# Patient Record
Sex: Female | Born: 1980 | Race: White | Hispanic: No | Marital: Single | Smoking: Never smoker
Health system: Southern US, Community
[De-identification: ages and names within clinical notes are randomized; demographics above are authoritative.]

## PROBLEM LIST (undated history)

## (undated) DIAGNOSIS — T7840XA Allergy, unspecified, initial encounter: Secondary | ICD-10-CM

## (undated) DIAGNOSIS — E079 Disorder of thyroid, unspecified: Secondary | ICD-10-CM

## (undated) DIAGNOSIS — K219 Gastro-esophageal reflux disease without esophagitis: Secondary | ICD-10-CM

## (undated) HISTORY — DX: Gastro-esophageal reflux disease without esophagitis: K21.9

## (undated) HISTORY — DX: Disorder of thyroid, unspecified: E07.9

## (undated) HISTORY — PX: LAPAROSCOPIC REMOVAL ABDOMINAL MASS: SHX6360

## (undated) HISTORY — PX: WISDOM TOOTH EXTRACTION: SHX21

## (undated) HISTORY — DX: Allergy, unspecified, initial encounter: T78.40XA

---

## 2011-08-28 DIAGNOSIS — D279 Benign neoplasm of unspecified ovary: Secondary | ICD-10-CM | POA: Insufficient documentation

## 2013-07-07 ENCOUNTER — Telehealth: Payer: Self-pay | Admitting: *Deleted

## 2013-07-07 NOTE — Telephone Encounter (Signed)
Pt asked if nerve conduction test scheduled.  I contacted [facility] - [phone] Diane, she requested dictation of last visit.  06/30/2013 not available.  I am going to refer to [facility] [phone] and left message on [phone] with [facility] contact number.

## 2013-07-07 NOTE — Telephone Encounter (Signed)
I faxed pt referral sheet and pt data sheet to [facility].

## 2013-07-10 ENCOUNTER — Telehealth: Payer: Self-pay | Admitting: *Deleted

## 2013-07-10 NOTE — Telephone Encounter (Signed)
Faxed pt's referral for nerve conduction studied to [facility].

## 2013-07-26 ENCOUNTER — Ambulatory Visit (INDEPENDENT_AMBULATORY_CARE_PROVIDER_SITE_OTHER): Payer: PRIVATE HEALTH INSURANCE

## 2013-07-26 VITALS — BP 121/58 | HR 74 | Resp 16 | Ht 67.0 in | Wt 180.0 lb

## 2013-07-26 DIAGNOSIS — G629 Polyneuropathy, unspecified: Secondary | ICD-10-CM

## 2013-07-26 DIAGNOSIS — G609 Hereditary and idiopathic neuropathy, unspecified: Secondary | ICD-10-CM

## 2013-07-26 MED ORDER — PREDNISONE 10 MG PO KIT: 1.0000 | PACK | Freq: Three times a day (TID) | ORAL | Status: DC

## 2013-07-26 NOTE — Progress Notes (Signed)
  Subjective:    Patient ID: Morgan Robertson, female    DOB: [birth year], 30 y.o.   MRN: [id number] "There's no change." Reviewed the nerve conduction studies which revealed no electrophysiologic evidence of radiculopathy or polyneuropathy or myopathy, no evidence of tarsal tunnel syndrome. Patient continues to have paresthesia in abnormal sensation or muscle sensation right leg and back pain right side. Patient also continues to have numbness of her left hallux as well. HPI this problem has been going on now for several months no history of acute injury or trauma the NSAIDs have not helped significantly.    Review of Systems not performed     Objective:   Physical Exam  Constitutional: She is oriented to person, place, and time. She appears well-developed and well-nourished.  Cardiovascular:  Pulses:      Dorsalis pedis pulses are 2+ on the right side, and 2+ on the left side.       Posterior tibial pulses are 2+ on the right side, and 2+ on the left side.  Capillary refill time 3 seconds all digits. Skin temperature warm. No edema rubor or varicosities noted.  Musculoskeletal:  Orthopedic biomechanical exam unremarkable rectus foot noted mild flexible digital contractures noted one through 5 bilateral  Neurological: She is alert and oriented to person, place, and time. She has normal strength and normal reflexes.   Epicritic sensations intact and unremarkable in all areas except left hallux which shows decreased sensation in touch. Patient is also relating some paresthesia or muscle fasciculations on her right leg posteriorly at the lower back pain as well. No changes muscle strength no other gait abnormality  Skin: Skin is warm. No cyanosis. Nails show no clubbing.  Skin color pigment normal hair growth absent nails normal bilateral. No other abnormal manifestations noted to  Psychiatric: She has a normal mood and affect. Her behavior is normal. Judgment normal.          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 Patient does relate that this was possibly exacerbated and she began running. As alternative to the spinous NSAID therapy which was not helping, patient is placed on a Sterapred DS dose pack x6 days followup with in 2 months. Maintain good athletic or walking shoes. Moderate activities avoid any running or ballistic activity reevaluate as indicated in 2 months  Alvan Dame DPM

## 2013-07-26 NOTE — Patient Instructions (Signed)
Peripheral Neuropathy Peripheral neuropathy is a common disorder of your nerves resulting from damage. CAUSES  This disorder may be caused by a disease of the nerves or illness. Many neuropathies have well known causes such as:  Diabetes. This is one of the most common causes.   Uremia.   AIDS.   Nutritional deficiencies.   Other causes include mechanical pressures. These may be from:   Compression.   Injury.   Contusions or bruises.   Fracture or dislocated bones.   Pressure involving the nerves close to the surface. Nerves such as the ulnar, or radial can be injured by prolonged use of crutches.  Other injuries may come from:  Tumor.   Hemorrhage or bleeding into a nerve.   Exposure to cold or radiation.   Certain medicines or toxic substances (rare).   Vascular or collagen disorders such as:   Atherosclerosis.   Systemic lupus erythematosus.   Scleroderma.   Sarcoidosis.   Rheumatoid arthritis.   Polyarteritis nodosa.   A large number of cases are of unknown cause.  SYMPTOMS  Common problems include:  Weakness.   Numbness.   Abnormal sensations (paresthesia) such as:   Burning.   Tickling.   Pricking.   Tingling.   Pain in the arms, hands, legs and/or feet.  TREATMENT  Therapy for this disorder differs depending on the cause. It may vary from medical treatment with medications or physical therapy among others.   For example, therapy for this disorder caused by diabetes involves control of the diabetes.   In cases where a tumor or ruptured disc is the cause, therapy may involve surgery. This would be to remove the tumor or to repair the ruptured disc.   In entrapment or compression neuropathy, treatment may consist of splinting or surgical decompression of the ulnar or median nerves. A common example of entrapment neuropathy is carpal tunnel syndrome. This has become more common because of the increasing use of computers.   Peroneal and  radial compression neuropathies may require avoidance of pressure.   Physical therapy and/or splints may be useful in preventing contractures. This is a condition in which shortened muscles around joints cause abnormal and sometimes painful positioning of the joints.  Document Released: 09/11/2002 Document Revised: 06/03/2011 Document Reviewed: 09/21/2005 [employer] Patient Information 2012 [employer]. 

## 2013-09-20 ENCOUNTER — Ambulatory Visit (INDEPENDENT_AMBULATORY_CARE_PROVIDER_SITE_OTHER): Payer: 59

## 2013-09-20 VITALS — BP 120/69 | HR 79 | Resp 12

## 2013-09-20 DIAGNOSIS — G629 Polyneuropathy, unspecified: Secondary | ICD-10-CM

## 2013-09-20 DIAGNOSIS — G609 Hereditary and idiopathic neuropathy, unspecified: Secondary | ICD-10-CM

## 2013-09-20 NOTE — Progress Notes (Signed)
   Subjective:    Patient ID: Morgan Robertson, female    DOB: [birth year], 32 y.o.   MRN: [id number]  HPI Comments: '' LT FOOT STILL NUMB  AND NOTHING CHANGE''  Foot Pain Associated symptoms include numbness.   numbness or reduced sensation isolated to the left great toe plantar and distal tuft. No changes despite the steroid dose pack application. Although she did have reduced back pain symptoms during the Dosepak regimen clinically no changes in the foot skin or structure.    Review of Systems  Constitutional: Negative.   HENT: Negative.   Eyes: Negative.   Respiratory: Negative.   Cardiovascular: Negative.   Gastrointestinal: Negative.   Endocrine: Negative.   Neurological: Positive for numbness.  Hematological: Negative.   Psychiatric/Behavioral: Negative.        Objective:   Physical Exam Neurovascular status is intact pedal pulses are palpable. Patient continues to have a loss of sensation or numb spot distal tuft of her left hallux. The steroid pack to was given did help reduce some of her back pain from her herniated disc however continues to have the numbness. Nerve conduction studies were otherwise unremarkable. At this time there is no evidence or sign of injury trauma or anything of the foot that could be causing this, therefore think further evaluation by either neurosurgery or orthopedics would be beneficial as the origin of the problem may be her herniated disc.       Assessment & Plan:  Assessment peripheral neuropathy affecting her left great toe with numbness loss of sensation. Idiopathic in nature although there is a history of herniated disc and back problems currently not being treated are evaluated. Did make recommendations will make a referral to see Dr. Bernarda Caffey: Shon Baton with [facility] orthopedics for evaluation. Patient will followup with [place] if any changes or exacerbation symptoms were to occur. In the interim maintain an anti-inflammatory if needed maintain  appropriate coming shoes at all times  Alvan Dame DPM

## 2013-09-21 ENCOUNTER — Other Ambulatory Visit: Payer: Self-pay | Admitting: *Deleted

## 2013-09-21 DIAGNOSIS — R200 Anesthesia of skin: Secondary | ICD-10-CM

## 2014-05-04 ENCOUNTER — Other Ambulatory Visit: Payer: Self-pay | Admitting: Family Medicine

## 2014-05-04 ENCOUNTER — Other Ambulatory Visit (HOSPITAL_COMMUNITY)
Admission: RE | Admit: 2014-05-04 | Discharge: 2014-05-04 | Disposition: A | Discharge status: Home / Self Care | Payer: BC Managed Care – PPO | Source: Ambulatory Visit | Attending: Family Medicine | Admitting: Family Medicine

## 2014-05-04 DIAGNOSIS — Z124 Encounter for screening for malignant neoplasm of cervix: Secondary | ICD-10-CM | POA: Insufficient documentation

## 2014-05-04 DIAGNOSIS — Z1151 Encounter for screening for human papillomavirus (HPV): Secondary | ICD-10-CM | POA: Insufficient documentation

## 2014-05-08 LAB — CYTOLOGY - PAP

## 2017-01-18 DIAGNOSIS — E039 Hypothyroidism, unspecified: Secondary | ICD-10-CM | POA: Diagnosis not present

## 2017-01-18 DIAGNOSIS — Z13220 Encounter for screening for lipoid disorders: Secondary | ICD-10-CM | POA: Diagnosis not present

## 2017-01-18 DIAGNOSIS — Z0000 Encounter for general adult medical examination without abnormal findings: Secondary | ICD-10-CM | POA: Diagnosis not present

## 2017-02-05 ENCOUNTER — Encounter: Payer: Self-pay | Admitting: Podiatry

## 2017-02-05 ENCOUNTER — Ambulatory Visit (INDEPENDENT_AMBULATORY_CARE_PROVIDER_SITE_OTHER): Payer: BLUE CROSS/BLUE SHIELD | Admitting: Podiatry

## 2017-02-05 ENCOUNTER — Ambulatory Visit (INDEPENDENT_AMBULATORY_CARE_PROVIDER_SITE_OTHER): Payer: BLUE CROSS/BLUE SHIELD

## 2017-02-05 VITALS — Resp 16 | Ht 67.0 in | Wt 208.0 lb

## 2017-02-05 DIAGNOSIS — G5762 Lesion of plantar nerve, left lower limb: Secondary | ICD-10-CM

## 2017-02-05 NOTE — Progress Notes (Signed)
   Subjective:    Patient ID: Morgan Robertson, female    DOB: Apr 13, 1981, 36 y.o.   MRN: [id number]  HPI 36 year old female presents to the office today for concern of pain to the left second and third toes and she is concerned about a possible neuroma. She states the pain worsens as she stands more at work. This has been ongoing for the last few months and has been about about the same with gradual worsening. No recent injury or trauma. No recent treatment. She has no other complaints.    Review of Systems  All other systems reviewed and are negative.      Objective:   Physical Exam General: AAO x3, NAD  Dermatological: Skin is warm, dry and supple bilateral. Nails x 10 are well manicured; remaining integument appears unremarkable at this time. There are no open sores, no preulcerative lesions, no rash or signs of infection present.  Vascular: Dorsalis Pedis artery and Posterior Tibial artery pedal pulses are 2/4 bilateral with immedate capillary fill time. There is no pain with calf compression, swelling, warmth, erythema.   Neruologic: Grossly intact via light touch bilateral. Vibratory intact via tuning fork bilateral. Protective threshold with Semmes Wienstein monofilament intact to all pedal sites bilateral.   Musculoskeletal: There is mild tenderness to palpation in the left second interspace. Unable to appreciate any neuroma today but there is a small clicking sensation upon palpation consistent with a small neuroma.There is no area of pinpoint bony tenderness or pain to vibratory sensation. No other areas of tenderness identified at this time. Muscular strength 5/5 in all groups tested bilateral.  Gait: Unassisted, Nonantalgic.     Assessment & Plan:  36 year old female likely neuroma left 2nd interspace -Treatment options discussed including all alternatives, risks, and complications -Etiology of symptoms were discussed -X-rays were obtained and reviewed with the patient. No  evidence of acute fracture -She would like to proceed with a steroid injection today.A mixture of Kenalog and local anesthetic and was infiltrated along the area of maximal tenderness without complications. Post-injection care discussed -Neuroma pads dispensed -Discussed shoe changes and possible inserts -RTC as scheduled or sooner if needed.  Celesta Gentile, DPM

## 2017-08-30 ENCOUNTER — Ambulatory Visit (INDEPENDENT_AMBULATORY_CARE_PROVIDER_SITE_OTHER): Payer: BLUE CROSS/BLUE SHIELD

## 2017-08-30 ENCOUNTER — Ambulatory Visit (INDEPENDENT_AMBULATORY_CARE_PROVIDER_SITE_OTHER): Payer: BLUE CROSS/BLUE SHIELD | Admitting: Podiatry

## 2017-08-30 ENCOUNTER — Encounter: Payer: Self-pay | Admitting: Podiatry

## 2017-08-30 DIAGNOSIS — M7752 Other enthesopathy of left foot: Secondary | ICD-10-CM | POA: Diagnosis not present

## 2017-08-30 DIAGNOSIS — M21622 Bunionette of left foot: Secondary | ICD-10-CM

## 2017-08-30 DIAGNOSIS — M659 Synovitis and tenosynovitis, unspecified: Secondary | ICD-10-CM

## 2017-09-01 ENCOUNTER — Telehealth: Payer: Self-pay | Admitting: Podiatry

## 2017-09-01 NOTE — Telephone Encounter (Signed)
I saw Dr. Jacqualyn Posey this past Monday. He was supposed so send an antiinflammatory to my pharmacy and as of yesterday evening they have not received the prescription. I wanted to double check and make sure it did get put in. Thank you.

## 2017-09-01 NOTE — Progress Notes (Signed)
Subjective: Ms. Meter presents the office today for concerns of painful feet also aspect of her left foot which is been ongoing for about 2 months and is intermittent. She states it hurts worse with standing or pressure. She denies any recent injury or trauma. She points along the fifth metatarsal head where she gets her symptoms. She has no numbness or tingling. No open sores. No recent injury or trauma. No treatment. No other complaints. Denies any systemic complaints such as fevers, chills, nausea, vomiting. No acute changes since last appointment, and no other complaints at this time.   Objective: AAO x3, NAD DP/PT pulses palpable bilaterally, CRT less than 3 seconds There is subjective tenderness to palpation to the lateral aspect the left foot on the fifth metatarsal head laterally as well as plantar submetatarsal 5 however no pain on today's exam. There is no area pinpoint tenderness there is no pain the vibratory sensation. There does appear to be a small amount of fluid present some metatarsal 5 consistent with a bursitis. There is no erythema or increase in warmth. No pain with MPJ range of motion. There is no other areas of tenderness of the foot at this time. No open lesions or pre-ulcerative lesions.  No pain with calf compression, swelling, warmth, erythema  Assessment: Bursitis, Taylor's bunion left foot  Plan: -All treatment options discussed with the patient including all alternatives, risks, complications.  -X-rays were obtained and reviewed. Tailors bunion present. No evidence of acute fracture. -Discussed a steroid injection was having no pain today is that she wishes to hold off on this. Since metatarsal offloading pads. Also prescribed a topical anti-inflammatory and she wishes to hold off on anti-inflammatories. If symptoms continue discussed inserts, custom insert with modifications. Also discussed a steroid injection of the future symptoms continue. -Patient encouraged to  call the office with any questions, concerns, change in symptoms.   Morgan Robertson DPM

## 2017-09-01 NOTE — Telephone Encounter (Signed)
Sent the RX over to [facility] and the patient should be getting a call shortly and prescribed the Anti-inflammatory cream. Lattie Haw

## 2017-09-30 ENCOUNTER — Ambulatory Visit (INDEPENDENT_AMBULATORY_CARE_PROVIDER_SITE_OTHER): Payer: BLUE CROSS/BLUE SHIELD | Admitting: Podiatry

## 2017-09-30 DIAGNOSIS — M779 Enthesopathy, unspecified: Secondary | ICD-10-CM

## 2017-09-30 DIAGNOSIS — M21622 Bunionette of left foot: Secondary | ICD-10-CM

## 2017-10-04 ENCOUNTER — Telehealth: Payer: Self-pay | Admitting: Podiatry

## 2017-10-04 NOTE — Telephone Encounter (Signed)
Called pt and gave her benefit information and she is going to decide and call me back.

## 2017-10-04 NOTE — Progress Notes (Signed)
Subjective: Morgan Robertson presents the office today for follow-up evaluation of pain to the left foot on the tailor's bunion.  She states that it is somewhat improved but she still gets discomfort in this area mostly with pressure and walking.  She describes the pain about 4/10 describes as an aching sensation.  Denies any recent injury or trauma.  She has been using the topical anti-inflammatory as well as offloading pads.  She has no other concerns today. Denies any systemic complaints such as fevers, chills, nausea, vomiting. No acute changes since last appointment, and no other complaints at this time.   Objective: AAO x3, NAD DP/PT pulses palpable bilaterally, CRT less than 3 seconds There is  tenderness to palpation to the lateral aspect the left foot on the fifth metatarsal head laterally as well as plantar submetatarsal 5.  Small amount of edema is present along this area or swelling consistent with a bursitis.  There is no overlying erythema or increase in warmth.  There is no ascending sialitis.  There is no clinical signs of infection present. No open lesions or pre-ulcerative lesions.  No pain with calf compression, swelling, warmth, erythema  Assessment: Capsulitis, Taylor's bunion left foot  Plan: -All treatment options discussed with the patient including all alternatives, risks, complications.  -At this point we discussed a steroid injection she wishes to proceed.  Area was anesthetized with 3 cc of lidocaine and Marcaine plain with alcohol prep.  Once anesthetized the skin was prepped with betadine and a mixture of 1 cc Kenalog 10, 1 cc 0.5% Marcaine plain was infiltrated into the left fifth metatarsal head on the tailor's bunion.  She tolerated this well without any complications. -Continue offloading pads. -Discussed inserts.  Trula Slade, DPM

## 2017-10-18 NOTE — Telephone Encounter (Signed)
Pt called back and would like to proceed with orthotics.

## 2017-11-12 ENCOUNTER — Encounter: Payer: BLUE CROSS/BLUE SHIELD | Admitting: Orthotics

## 2017-11-12 DIAGNOSIS — M7751 Other enthesopathy of right foot: Secondary | ICD-10-CM | POA: Diagnosis not present

## 2017-11-12 DIAGNOSIS — M7752 Other enthesopathy of left foot: Secondary | ICD-10-CM | POA: Diagnosis not present

## 2018-01-24 DIAGNOSIS — E039 Hypothyroidism, unspecified: Secondary | ICD-10-CM | POA: Diagnosis not present

## 2018-05-30 DIAGNOSIS — H1045 Other chronic allergic conjunctivitis: Secondary | ICD-10-CM | POA: Diagnosis not present

## 2018-06-27 DIAGNOSIS — M62830 Muscle spasm of back: Secondary | ICD-10-CM | POA: Diagnosis not present

## 2018-06-27 DIAGNOSIS — R002 Palpitations: Secondary | ICD-10-CM | POA: Diagnosis not present

## 2018-07-22 DIAGNOSIS — B078 Other viral warts: Secondary | ICD-10-CM | POA: Diagnosis not present

## 2019-05-19 ENCOUNTER — Other Ambulatory Visit: Payer: Self-pay | Admitting: Family Medicine

## 2019-05-19 ENCOUNTER — Other Ambulatory Visit (HOSPITAL_COMMUNITY)
Admission: RE | Admit: 2019-05-19 | Discharge: 2019-05-19 | Disposition: A | Discharge status: Home / Self Care | Payer: BC Managed Care – PPO | Source: Ambulatory Visit | Attending: Family Medicine | Admitting: Family Medicine

## 2019-05-19 DIAGNOSIS — Z01411 Encounter for gynecological examination (general) (routine) with abnormal findings: Secondary | ICD-10-CM | POA: Diagnosis not present

## 2019-05-19 DIAGNOSIS — Z13220 Encounter for screening for lipoid disorders: Secondary | ICD-10-CM | POA: Diagnosis not present

## 2019-05-19 DIAGNOSIS — Z79899 Other long term (current) drug therapy: Secondary | ICD-10-CM | POA: Diagnosis not present

## 2019-05-19 DIAGNOSIS — E039 Hypothyroidism, unspecified: Secondary | ICD-10-CM | POA: Diagnosis not present

## 2019-05-19 DIAGNOSIS — Z0000 Encounter for general adult medical examination without abnormal findings: Secondary | ICD-10-CM | POA: Diagnosis not present

## 2019-05-23 LAB — CYTOLOGY - PAP
Diagnosis: NEGATIVE
HPV: NOT DETECTED

## 2019-06-15 DIAGNOSIS — Z23 Encounter for immunization: Secondary | ICD-10-CM | POA: Diagnosis not present

## 2019-08-04 ENCOUNTER — Other Ambulatory Visit: Payer: Self-pay

## 2019-08-04 ENCOUNTER — Emergency Department (HOSPITAL_COMMUNITY)
Admission: EM | Admit: 2019-08-04 | Discharge: 2019-08-04 | Disposition: A | Discharge status: Home / Self Care | Payer: No Typology Code available for payment source | Attending: Emergency Medicine | Admitting: Emergency Medicine

## 2019-08-04 ENCOUNTER — Encounter (HOSPITAL_COMMUNITY): Payer: Self-pay

## 2019-08-04 DIAGNOSIS — Y9389 Activity, other specified: Secondary | ICD-10-CM | POA: Diagnosis not present

## 2019-08-04 DIAGNOSIS — Y929 Unspecified place or not applicable: Secondary | ICD-10-CM | POA: Insufficient documentation

## 2019-08-04 DIAGNOSIS — S61211A Laceration without foreign body of left index finger without damage to nail, initial encounter: Secondary | ICD-10-CM | POA: Insufficient documentation

## 2019-08-04 DIAGNOSIS — Y990 Civilian activity done for income or pay: Secondary | ICD-10-CM | POA: Insufficient documentation

## 2019-08-04 DIAGNOSIS — Z79899 Other long term (current) drug therapy: Secondary | ICD-10-CM | POA: Insufficient documentation

## 2019-08-04 DIAGNOSIS — W268XXA Contact with other sharp object(s), not elsewhere classified, initial encounter: Secondary | ICD-10-CM | POA: Insufficient documentation

## 2019-08-04 NOTE — Discharge Instructions (Signed)
1. Medications: Tylenol or ibuprofen for pain, usual home medications  2. Treatment: ice for swelling, keep wound clean with warm soap and water and keep bandage dry, do not submerge in water for 24 hours  3. Follow Up: Follow-up with primary care doctor to have wound checked in 2 days if needed. Return to the emergency department for increased redness, drainage of pus from the wound   WOUND CARE  Keep area clean and dry for 24 hours. Do not remove bandage, if applied.  After 24 hours, remove bandage and wash wound gently with mild soap and warm water. Reapply a new bandage after cleaning wound, if directed.   Continue daily cleansing with soap and water until stitches/staples are removed. Return if you experience any of the following signs of infection: Swelling, redness, pus drainage, streaking, fever >101.0 F  Return if you experience excessive bleeding that does not stop after 15-20 minutes of constant, firm pressure.

## 2019-08-04 NOTE — ED Triage Notes (Signed)
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 States she's "washed it a couple times". Stinging pain

## 2019-08-04 NOTE — ED Provider Notes (Signed)
[facility] EMERGENCY DEPARTMENT Provider Note   CSN: [id number] Arrival date & time: 08/04/19  1436     History   Chief Complaint Chief Complaint  Patient presents with  . Finger Injury    HPI Morgan Robertson is a 38 y.o. female with past medical history as listed below presents to emergency department today with chief complaint of finger injury.  Onset was acute happening 1 hour prior to arrival.  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  She immediately washed the laceration and applied pressure.  She denies any associated pain.  Did not take anything for pain prior to arrival.  She states her tetanus vaccineis up-to-date.  She denies any numbness, tingling, weakness.  Patient is not anticoagulated.   Past Medical History:  Diagnosis Date  . Allergy   . GERD (gastroesophageal reflux disease)   . Thyroid disease     There are no active problems to display for this patient.   Past Surgical History:  Procedure Laterality Date  . LAPAROSCOPIC REMOVAL ABDOMINAL MASS    . WISDOM TOOTH EXTRACTION       OB History   No obstetric history on file.      Home Medications    Prior to Admission medications   Medication Sig Start Date End Date Taking? Authorizing Provider  levothyroxine (SYNTHROID, LEVOTHROID) 125 MCG tablet Take 125 mcg by mouth daily before breakfast.    [provider]  Multiple Vitamin (MULTIVITAMIN) capsule Take 1 capsule by mouth daily.    [provider]  NON [facility]  Anti-Inflammatory Cream- Diclofenac 3%, Baclofen 2%, Lidocaine 2% Apply 1-2 grams to affected area 3-4 times daily Qty. 120 gm 3 refills    [provider]  Tretinoin Microsphere Pump 0.04 % GEL  01/19/17   [provider]    Family History Family History  Problem Relation Age of Onset  . Hypertension Mother   .  Hyperlipidemia Father   . Hypertension Father     Social History Social History   Tobacco Use  . Smoking status: Never Smoker  . Smokeless tobacco: Never Used  Substance Use Topics  . Alcohol use: Yes    Alcohol/week: 1.0 standard drinks    Types: 1 Glasses of wine per week    Comment: one glass couple times a week.  . Drug use: No     Allergies   Patient has no known allergies.   Review of Systems Review of Systems  Constitutional: Negative for chills and fever.  Musculoskeletal: Negative for arthralgias, joint swelling and myalgias.  Skin: Positive for wound. Negative for rash.  Allergic/Immunologic: Negative for immunocompromised state.  Neurological: Negative for weakness and numbness.     Physical Exam Updated Vital Signs BP 122/85 (BP Location: Right Arm)   Pulse 81   Temp 97.8 F (36.6 C) (Oral)   Resp 16   Ht 5\' 7"  (1.702 m)   Wt 98.9 kg   SpO2 100%   BMI 34.14 kg/m   Physical Exam Vitals signs and nursing note reviewed.  Constitutional:      Appearance: She is well-developed. She is not ill-appearing or toxic-appearing.  HENT:     Head: Normocephalic and atraumatic.     Nose: Nose normal.  Eyes:     General: No scleral icterus.       Right eye: No discharge.  Left eye: No discharge.     Conjunctiva/sclera: Conjunctivae normal.  Neck:     Musculoskeletal: Normal range of motion.     Vascular: No JVD.  Cardiovascular:     Rate and Rhythm: Normal rate and regular rhythm.     Pulses:          Radial pulses are 2+ on the right side and 2+ on the left side.     Heart sounds: Normal heart sounds.  Pulmonary:     Effort: Pulmonary effort is normal.     Breath sounds: Normal breath sounds.  Abdominal:     General: There is no distension.  Musculoskeletal: Normal range of motion.  Skin:    General: Skin is warm and dry.     Comments: 1 cm skin tear to lateral aspect of middle left index finger.  Nailbed is not involved.  Bleeding is  controlled.  Full range of motion of index finger. Strong and equal grip strength bilaterally.   Neurological:     Mental Status: She is oriented to person, place, and time.     GCS: GCS eye subscore is 4. GCS verbal subscore is 5. GCS motor subscore is 6.     Comments: Fluent speech, no facial droop.  Psychiatric:        Behavior: Behavior normal.      ED Treatments / Results  Labs (all labs ordered are listed, but only abnormal results are displayed) Labs Reviewed - No data to display  EKG None  Radiology No results found.  Procedures .Marland KitchenLaceration Repair  Date/Time: 08/04/2019 4:27 PM Performed by: Cherre Robins, PA-C Authorized by: Cherre Robins, PA-C   Consent:    Consent obtained:  Verbal   Consent given by:  Patient   Risks discussed:  Infection, pain and retained foreign body   Alternatives discussed:  No treatment Anesthesia (see MAR for exact dosages):    Anesthesia method:  None Laceration details:    Location:  Finger   Finger location:  L index finger   Length (cm):  1 Repair type:    Repair type:  Simple Pre-procedure details:    Preparation:  Patient was prepped and draped in usual sterile fashion and imaging obtained to evaluate for foreign bodies Exploration:    Hemostasis achieved with:  Direct pressure   Wound exploration: wound explored through full range of motion and entire depth of wound probed and visualized     Wound extent: no muscle damage noted and no tendon damage noted     Contaminated: no   Treatment:    Area cleansed with:  Saline   Amount of cleaning:  Standard   Irrigation solution:  Sterile saline   Irrigation volume:  1 L   Irrigation method:  Syringe   Visualized foreign bodies/material removed: no   Skin repair:    Repair method:  Steri-Strips   Number of Steri-Strips:  2 Approximation:    Approximation:  Close Post-procedure details:    Dressing:  Bulky dressing   Patient tolerance of procedure:   Tolerated well, no immediate complications   (including critical care time)  Medications Ordered in ED Medications - No data to display   Initial Impression / Assessment and Plan / ED Course  I have reviewed the triage vital signs and the nursing notes.  Pertinent labs & imaging results that were available during my care of the patient were reviewed by me and considered in my medical decision making (see chart for  details).  Patient presents to the emergency department with skin tear to left index finger which occurred within 1 hours PTA . Patient nontoxic appearing, resting comfortably.  Strong radial pulses bilaterally.  Very low suspicion for underlying fracture. Discussed ordering an xray with the patient and she agrees that is not needed at this time.  Patient informs me she has spoken with her supervisor in regards to blood work.  She does not need any at this time but will provide a urine drug screen per company policy.  Pressure irrigation performed. Wound explored and base of wound visualized in a bloodless field without evidence of foreign body.  Wound not amenable to suture repair.  Closed with Steri-Strips.  Patient tolerated well.  Tetanus is up to date. Do not feel that abx are indicated at this time based on wound appearance and lack of significant comorbidities. Discussed wound home care. I discussed treatment plan, need for follow-up, and return precautions with the patient including signs of infection. Provided opportunity for questions, patient confirmed understanding and is in agreement with plan.      Portions of this note were generated with [employer]. Dictation errors may occur despite best attempts at proofreading.  Final Clinical Impressions(s) / ED Diagnoses   Final diagnoses:  Laceration of left index finger without foreign body without damage to nail, initial encounter    ED Discharge Orders    None       Cherre Robins, PA-C 08/04/19  1629    Wyvonnia Dusky, MD 08/05/19 [id number]

## 2019-08-08 ENCOUNTER — Ambulatory Visit (INDEPENDENT_AMBULATORY_CARE_PROVIDER_SITE_OTHER): Payer: BLUE CROSS/BLUE SHIELD

## 2019-08-08 ENCOUNTER — Encounter: Payer: Self-pay | Admitting: Podiatry

## 2019-08-08 ENCOUNTER — Ambulatory Visit: Payer: BLUE CROSS/BLUE SHIELD | Admitting: Podiatry

## 2019-08-08 ENCOUNTER — Other Ambulatory Visit: Payer: Self-pay

## 2019-08-08 DIAGNOSIS — M6740 Ganglion, unspecified site: Secondary | ICD-10-CM

## 2019-08-08 DIAGNOSIS — M7989 Other specified soft tissue disorders: Secondary | ICD-10-CM | POA: Diagnosis not present

## 2019-08-14 NOTE — Progress Notes (Signed)
Subjective: 38 year old female presents the office today for concerns of a cyst on the top of her right foot yesterday.  She has been better today but it still hurts with shoes.  She is noticed a raised bump.  Denies any recent injury or trauma.  Not changed in size.  No recent treatment.  Denies any systemic complaints such as fevers, chills, nausea, vomiting. No acute changes since last appointment, and no other complaints at this time.   Objective: AAO x3, NAD DP/PT pulses palpable bilaterally, CRT less than 3 seconds On the dorsal aspect the right foot is a fluid-filled mobile soft tissue mass.  There is no area of tenderness otherwise except for this lesion. No pain with calf compression, swelling, warmth, erythema  Assessment: Soft tissue mass right foot  Plan: -All treatment options discussed with the patient including all alternatives, risks, complications.  -X-rays obtained and reviewed.  A skin marker was utilized to identify the area of the lesion.  No calcifications or foreign body present. -Steroid injection performed to the area.  Verbal consent was obtained.  A mixture of 0.5 cc dexamethasone phosphate 0.5 cc of Marcaine plain was infiltrated into the soft tissue lesion without complications.  Postinjection care discussed.  Monitor for any signs or symptoms of infection. -Patient encouraged to call the office with any questions, concerns, change in symptoms.   Return if symptoms worsen or fail to improve.  Trula Slade DPM

## 2019-12-25 DIAGNOSIS — Z1159 Encounter for screening for other viral diseases: Secondary | ICD-10-CM | POA: Diagnosis not present

## 2019-12-26 DIAGNOSIS — J301 Allergic rhinitis due to pollen: Secondary | ICD-10-CM | POA: Diagnosis not present

## 2020-01-04 DIAGNOSIS — J029 Acute pharyngitis, unspecified: Secondary | ICD-10-CM | POA: Diagnosis not present

## 2020-01-04 DIAGNOSIS — R0982 Postnasal drip: Secondary | ICD-10-CM | POA: Diagnosis not present

## 2020-05-24 DIAGNOSIS — K219 Gastro-esophageal reflux disease without esophagitis: Secondary | ICD-10-CM | POA: Diagnosis not present

## 2020-05-24 DIAGNOSIS — Z6837 Body mass index (BMI) 37.0-37.9, adult: Secondary | ICD-10-CM | POA: Diagnosis not present

## 2020-05-24 DIAGNOSIS — E039 Hypothyroidism, unspecified: Secondary | ICD-10-CM | POA: Diagnosis not present

## 2020-05-24 DIAGNOSIS — Z79899 Other long term (current) drug therapy: Secondary | ICD-10-CM | POA: Diagnosis not present

## 2020-05-24 DIAGNOSIS — Z13220 Encounter for screening for lipoid disorders: Secondary | ICD-10-CM | POA: Diagnosis not present

## 2020-05-24 DIAGNOSIS — R1013 Epigastric pain: Secondary | ICD-10-CM | POA: Diagnosis not present

## 2020-05-24 DIAGNOSIS — K8020 Calculus of gallbladder without cholecystitis without obstruction: Secondary | ICD-10-CM | POA: Diagnosis not present

## 2020-05-24 DIAGNOSIS — M25511 Pain in right shoulder: Secondary | ICD-10-CM | POA: Diagnosis not present

## 2020-06-07 DIAGNOSIS — K8020 Calculus of gallbladder without cholecystitis without obstruction: Secondary | ICD-10-CM | POA: Diagnosis not present

## 2020-06-25 DIAGNOSIS — K219 Gastro-esophageal reflux disease without esophagitis: Secondary | ICD-10-CM | POA: Diagnosis not present

## 2020-06-25 DIAGNOSIS — R1013 Epigastric pain: Secondary | ICD-10-CM | POA: Diagnosis not present

## 2020-09-26 DIAGNOSIS — K8020 Calculus of gallbladder without cholecystitis without obstruction: Secondary | ICD-10-CM | POA: Diagnosis not present

## 2020-09-26 DIAGNOSIS — R1013 Epigastric pain: Secondary | ICD-10-CM | POA: Diagnosis not present

## 2020-10-14 ENCOUNTER — Other Ambulatory Visit (HOSPITAL_COMMUNITY): Payer: Self-pay | Admitting: Physician Assistant

## 2020-10-14 ENCOUNTER — Other Ambulatory Visit: Payer: Self-pay | Admitting: Physician Assistant

## 2020-10-17 ENCOUNTER — Other Ambulatory Visit (HOSPITAL_COMMUNITY): Payer: Self-pay | Admitting: Physician Assistant

## 2020-10-17 ENCOUNTER — Other Ambulatory Visit: Payer: Self-pay | Admitting: Physician Assistant

## 2020-10-17 DIAGNOSIS — K8020 Calculus of gallbladder without cholecystitis without obstruction: Secondary | ICD-10-CM

## 2020-10-17 DIAGNOSIS — R1013 Epigastric pain: Secondary | ICD-10-CM

## 2020-11-06 ENCOUNTER — Other Ambulatory Visit: Payer: Self-pay

## 2020-11-06 ENCOUNTER — Ambulatory Visit (HOSPITAL_COMMUNITY)
Admission: RE | Admit: 2020-11-06 | Discharge: 2020-11-06 | Disposition: A | Discharge status: Home / Self Care | Payer: BC Managed Care – PPO | Source: Ambulatory Visit | Attending: Physician Assistant | Admitting: Physician Assistant

## 2020-11-06 DIAGNOSIS — K8020 Calculus of gallbladder without cholecystitis without obstruction: Secondary | ICD-10-CM | POA: Diagnosis not present

## 2020-11-06 DIAGNOSIS — R1013 Epigastric pain: Secondary | ICD-10-CM | POA: Diagnosis not present

## 2020-11-06 DIAGNOSIS — R1011 Right upper quadrant pain: Secondary | ICD-10-CM | POA: Diagnosis not present

## 2020-11-06 DIAGNOSIS — Z8719 Personal history of other diseases of the digestive system: Secondary | ICD-10-CM | POA: Diagnosis not present

## 2020-11-06 IMAGING — NM NM HEPATO W/GB/PHARM/[PERSON_NAME]
3 series · 18 of 18 positions shown · non-contrast
Comparison: Ultrasound [DATE]

CLINICAL DATA: RIGHT upper quadrant pain. History of gallstones.
Ejection fraction evaluation ordered.

EXAM:
NUCLEAR MEDICINE HEPATOBILIARY IMAGING
TECHNIQUE: Sequential images of the abdomen were obtained [DATE] minutes
following intravenous administration of radiopharmaceutical.
RADIOPHARMACEUTICALS:  5.2 mCi [OK]  Choletec IV

[he hepatobiliary · 4.52mm/px · 6 of 30 frames shown (1 of 3)]
[frame 3/30]
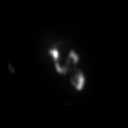
[frame 8/30]
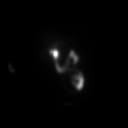
[frame 13/30]
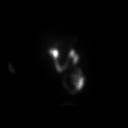
[frame 18/30]
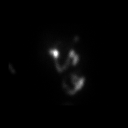
[frame 23/30]
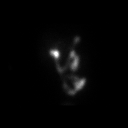
[frame 28/30]
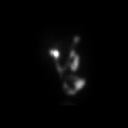

[he hepatobiliary · 4.52mm/px · 6 of 60 frames shown (2 of 3)]
[frame 6/60]
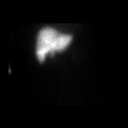
[frame 16/60]
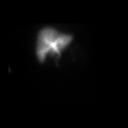
[frame 26/60]
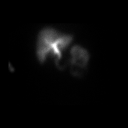
[frame 36/60]
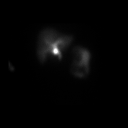
[frame 46/60]
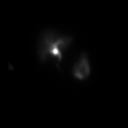
[frame 56/60]
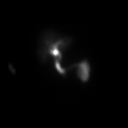

[he hepatobiliary · 4.52mm/px · 6 of 30 frames shown (3 of 3)]
[frame 3/30]
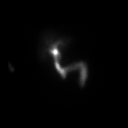
[frame 8/30]
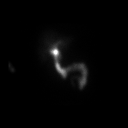
[frame 13/30]
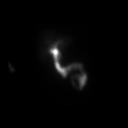
[frame 18/30]
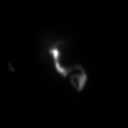
[frame 23/30]
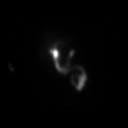
[frame 28/30]
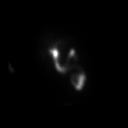

[18 of 18 positions shown; findings below may reference images not displayed]

FINDINGS: Prompt clearance radiotracer from blood pool and homogeneous uptake
in liver. Counts are evident within the common bile duct and small
bowel by 30 minutes. Gallbladder does not fill over 120 minutes of
imaging. Morphine could not be administered ( augments filling of
the gallbladder) due to outpatient status with no driver available.

Additionally, ejection fraction cannot be calculated without
gallbladder filling.
IMPRESSION: 1. Patent common bile duct.
2. Gallbladder does not fill over 120 minutes. Patient was not in
any distress or signs of acute infection therefore favor either
chronic cholecystitis versus atypical benign delayed (non) filling
of the gallbladder.
3. Ejection fraction could not be calculated as above.

## 2020-11-06 MED ORDER — TECHNETIUM TC 99M MEBROFENIN IV KIT: 5.0000 | PACK | Freq: Once | INTRAVENOUS | Status: DC | PRN

## 2020-12-11 DIAGNOSIS — K811 Chronic cholecystitis: Secondary | ICD-10-CM | POA: Diagnosis not present

## 2021-02-04 DIAGNOSIS — L732 Hidradenitis suppurativa: Secondary | ICD-10-CM | POA: Diagnosis not present

## 2021-02-04 DIAGNOSIS — L720 Epidermal cyst: Secondary | ICD-10-CM | POA: Diagnosis not present

## 2021-03-28 ENCOUNTER — Other Ambulatory Visit: Payer: Self-pay | Admitting: Surgery

## 2021-03-28 DIAGNOSIS — K8010 Calculus of gallbladder with chronic cholecystitis without obstruction: Secondary | ICD-10-CM | POA: Diagnosis not present

## 2021-03-28 DIAGNOSIS — K812 Acute cholecystitis with chronic cholecystitis: Secondary | ICD-10-CM | POA: Diagnosis not present

## 2021-04-03 DIAGNOSIS — L720 Epidermal cyst: Secondary | ICD-10-CM | POA: Diagnosis not present

## 2021-04-03 DIAGNOSIS — L905 Scar conditions and fibrosis of skin: Secondary | ICD-10-CM | POA: Diagnosis not present

## 2021-04-18 DIAGNOSIS — K8010 Calculus of gallbladder with chronic cholecystitis without obstruction: Secondary | ICD-10-CM | POA: Insufficient documentation

## 2021-04-18 DIAGNOSIS — Z9889 Other specified postprocedural states: Secondary | ICD-10-CM | POA: Insufficient documentation

## 2021-05-26 DIAGNOSIS — E039 Hypothyroidism, unspecified: Secondary | ICD-10-CM | POA: Diagnosis not present

## 2021-05-26 DIAGNOSIS — Z79899 Other long term (current) drug therapy: Secondary | ICD-10-CM | POA: Diagnosis not present

## 2021-05-26 DIAGNOSIS — M25511 Pain in right shoulder: Secondary | ICD-10-CM | POA: Diagnosis not present

## 2021-06-06 DIAGNOSIS — M7541 Impingement syndrome of right shoulder: Secondary | ICD-10-CM | POA: Diagnosis not present

## 2021-07-05 DIAGNOSIS — M25511 Pain in right shoulder: Secondary | ICD-10-CM | POA: Diagnosis not present

## 2021-07-15 DIAGNOSIS — M25511 Pain in right shoulder: Secondary | ICD-10-CM | POA: Diagnosis not present

## 2021-07-15 DIAGNOSIS — E039 Hypothyroidism, unspecified: Secondary | ICD-10-CM | POA: Diagnosis not present

## 2021-08-22 DIAGNOSIS — M7541 Impingement syndrome of right shoulder: Secondary | ICD-10-CM | POA: Diagnosis not present

## 2021-09-01 DIAGNOSIS — J0100 Acute maxillary sinusitis, unspecified: Secondary | ICD-10-CM | POA: Diagnosis not present

## 2021-09-01 DIAGNOSIS — U071 COVID-19: Secondary | ICD-10-CM | POA: Diagnosis not present

## 2021-09-01 DIAGNOSIS — R0981 Nasal congestion: Secondary | ICD-10-CM | POA: Diagnosis not present

## 2021-10-27 DIAGNOSIS — M7541 Impingement syndrome of right shoulder: Secondary | ICD-10-CM | POA: Diagnosis not present

## 2021-10-27 DIAGNOSIS — M7918 Myalgia, other site: Secondary | ICD-10-CM | POA: Diagnosis not present

## 2021-10-27 DIAGNOSIS — M25511 Pain in right shoulder: Secondary | ICD-10-CM | POA: Diagnosis not present

## 2021-10-27 DIAGNOSIS — M9902 Segmental and somatic dysfunction of thoracic region: Secondary | ICD-10-CM | POA: Diagnosis not present

## 2021-10-30 DIAGNOSIS — M9902 Segmental and somatic dysfunction of thoracic region: Secondary | ICD-10-CM | POA: Diagnosis not present

## 2021-10-30 DIAGNOSIS — M25511 Pain in right shoulder: Secondary | ICD-10-CM | POA: Diagnosis not present

## 2021-10-30 DIAGNOSIS — M7541 Impingement syndrome of right shoulder: Secondary | ICD-10-CM | POA: Diagnosis not present

## 2021-10-30 DIAGNOSIS — M7918 Myalgia, other site: Secondary | ICD-10-CM | POA: Diagnosis not present

## 2021-11-05 DIAGNOSIS — M25511 Pain in right shoulder: Secondary | ICD-10-CM | POA: Diagnosis not present

## 2021-11-05 DIAGNOSIS — M7918 Myalgia, other site: Secondary | ICD-10-CM | POA: Diagnosis not present

## 2021-11-05 DIAGNOSIS — M7541 Impingement syndrome of right shoulder: Secondary | ICD-10-CM | POA: Diagnosis not present

## 2021-11-05 DIAGNOSIS — M9902 Segmental and somatic dysfunction of thoracic region: Secondary | ICD-10-CM | POA: Diagnosis not present

## 2021-11-12 DIAGNOSIS — M9902 Segmental and somatic dysfunction of thoracic region: Secondary | ICD-10-CM | POA: Diagnosis not present

## 2021-11-12 DIAGNOSIS — M7541 Impingement syndrome of right shoulder: Secondary | ICD-10-CM | POA: Diagnosis not present

## 2021-11-12 DIAGNOSIS — M7918 Myalgia, other site: Secondary | ICD-10-CM | POA: Diagnosis not present

## 2021-11-12 DIAGNOSIS — M25511 Pain in right shoulder: Secondary | ICD-10-CM | POA: Diagnosis not present

## 2021-11-27 DIAGNOSIS — M7541 Impingement syndrome of right shoulder: Secondary | ICD-10-CM | POA: Diagnosis not present

## 2021-11-27 DIAGNOSIS — M7918 Myalgia, other site: Secondary | ICD-10-CM | POA: Diagnosis not present

## 2021-11-27 DIAGNOSIS — M9902 Segmental and somatic dysfunction of thoracic region: Secondary | ICD-10-CM | POA: Diagnosis not present

## 2021-11-27 DIAGNOSIS — M25511 Pain in right shoulder: Secondary | ICD-10-CM | POA: Diagnosis not present

## 2021-12-10 DIAGNOSIS — M9902 Segmental and somatic dysfunction of thoracic region: Secondary | ICD-10-CM | POA: Diagnosis not present

## 2021-12-10 DIAGNOSIS — M7541 Impingement syndrome of right shoulder: Secondary | ICD-10-CM | POA: Diagnosis not present

## 2021-12-10 DIAGNOSIS — M7918 Myalgia, other site: Secondary | ICD-10-CM | POA: Diagnosis not present

## 2021-12-10 DIAGNOSIS — M25511 Pain in right shoulder: Secondary | ICD-10-CM | POA: Diagnosis not present

## 2021-12-24 DIAGNOSIS — M7541 Impingement syndrome of right shoulder: Secondary | ICD-10-CM | POA: Diagnosis not present

## 2021-12-24 DIAGNOSIS — M7918 Myalgia, other site: Secondary | ICD-10-CM | POA: Diagnosis not present

## 2021-12-24 DIAGNOSIS — M9902 Segmental and somatic dysfunction of thoracic region: Secondary | ICD-10-CM | POA: Diagnosis not present

## 2021-12-24 DIAGNOSIS — M25511 Pain in right shoulder: Secondary | ICD-10-CM | POA: Diagnosis not present

## 2022-01-21 DIAGNOSIS — E039 Hypothyroidism, unspecified: Secondary | ICD-10-CM | POA: Diagnosis not present

## 2022-01-22 DIAGNOSIS — M9902 Segmental and somatic dysfunction of thoracic region: Secondary | ICD-10-CM | POA: Diagnosis not present

## 2022-01-22 DIAGNOSIS — M7918 Myalgia, other site: Secondary | ICD-10-CM | POA: Diagnosis not present

## 2022-01-22 DIAGNOSIS — M7541 Impingement syndrome of right shoulder: Secondary | ICD-10-CM | POA: Diagnosis not present

## 2022-01-22 DIAGNOSIS — M25511 Pain in right shoulder: Secondary | ICD-10-CM | POA: Diagnosis not present

## 2022-02-06 DIAGNOSIS — M25511 Pain in right shoulder: Secondary | ICD-10-CM | POA: Diagnosis not present

## 2022-02-06 DIAGNOSIS — M7918 Myalgia, other site: Secondary | ICD-10-CM | POA: Diagnosis not present

## 2022-02-06 DIAGNOSIS — M9902 Segmental and somatic dysfunction of thoracic region: Secondary | ICD-10-CM | POA: Diagnosis not present

## 2022-02-06 DIAGNOSIS — M7541 Impingement syndrome of right shoulder: Secondary | ICD-10-CM | POA: Diagnosis not present

## 2022-02-18 DIAGNOSIS — M7541 Impingement syndrome of right shoulder: Secondary | ICD-10-CM | POA: Diagnosis not present

## 2022-02-18 DIAGNOSIS — M25511 Pain in right shoulder: Secondary | ICD-10-CM | POA: Diagnosis not present

## 2022-02-18 DIAGNOSIS — M7918 Myalgia, other site: Secondary | ICD-10-CM | POA: Diagnosis not present

## 2022-02-18 DIAGNOSIS — M9902 Segmental and somatic dysfunction of thoracic region: Secondary | ICD-10-CM | POA: Diagnosis not present

## 2022-03-05 DIAGNOSIS — L720 Epidermal cyst: Secondary | ICD-10-CM | POA: Diagnosis not present

## 2022-03-23 DIAGNOSIS — M7918 Myalgia, other site: Secondary | ICD-10-CM | POA: Diagnosis not present

## 2022-03-23 DIAGNOSIS — M7541 Impingement syndrome of right shoulder: Secondary | ICD-10-CM | POA: Diagnosis not present

## 2022-03-23 DIAGNOSIS — M25511 Pain in right shoulder: Secondary | ICD-10-CM | POA: Diagnosis not present

## 2022-03-23 DIAGNOSIS — M9902 Segmental and somatic dysfunction of thoracic region: Secondary | ICD-10-CM | POA: Diagnosis not present

## 2022-04-15 DIAGNOSIS — M7918 Myalgia, other site: Secondary | ICD-10-CM | POA: Diagnosis not present

## 2022-04-15 DIAGNOSIS — M25511 Pain in right shoulder: Secondary | ICD-10-CM | POA: Diagnosis not present

## 2022-04-15 DIAGNOSIS — M7541 Impingement syndrome of right shoulder: Secondary | ICD-10-CM | POA: Diagnosis not present

## 2022-04-15 DIAGNOSIS — M9902 Segmental and somatic dysfunction of thoracic region: Secondary | ICD-10-CM | POA: Diagnosis not present

## 2022-04-29 DIAGNOSIS — M25511 Pain in right shoulder: Secondary | ICD-10-CM | POA: Diagnosis not present

## 2022-04-29 DIAGNOSIS — M7918 Myalgia, other site: Secondary | ICD-10-CM | POA: Diagnosis not present

## 2022-04-29 DIAGNOSIS — M7541 Impingement syndrome of right shoulder: Secondary | ICD-10-CM | POA: Diagnosis not present

## 2022-04-29 DIAGNOSIS — M9902 Segmental and somatic dysfunction of thoracic region: Secondary | ICD-10-CM | POA: Diagnosis not present

## 2022-05-07 DIAGNOSIS — H6992 Unspecified Eustachian tube disorder, left ear: Secondary | ICD-10-CM | POA: Diagnosis not present

## 2022-05-20 DIAGNOSIS — H938X9 Other specified disorders of ear, unspecified ear: Secondary | ICD-10-CM | POA: Diagnosis not present

## 2022-05-20 DIAGNOSIS — H6980 Other specified disorders of Eustachian tube, unspecified ear: Secondary | ICD-10-CM | POA: Diagnosis not present

## 2022-07-01 ENCOUNTER — Other Ambulatory Visit: Payer: Self-pay | Admitting: Family Medicine

## 2022-07-01 ENCOUNTER — Other Ambulatory Visit (HOSPITAL_COMMUNITY)
Admission: RE | Admit: 2022-07-01 | Discharge: 2022-07-01 | Disposition: A | Discharge status: Home / Self Care | Payer: BC Managed Care – PPO | Source: Ambulatory Visit | Attending: Family Medicine | Admitting: Family Medicine

## 2022-07-01 DIAGNOSIS — Z0000 Encounter for general adult medical examination without abnormal findings: Secondary | ICD-10-CM | POA: Diagnosis not present

## 2022-07-01 DIAGNOSIS — Z79899 Other long term (current) drug therapy: Secondary | ICD-10-CM | POA: Diagnosis not present

## 2022-07-01 DIAGNOSIS — E039 Hypothyroidism, unspecified: Secondary | ICD-10-CM | POA: Diagnosis not present

## 2022-07-01 DIAGNOSIS — Z13220 Encounter for screening for lipoid disorders: Secondary | ICD-10-CM | POA: Diagnosis not present

## 2022-07-01 DIAGNOSIS — Z01411 Encounter for gynecological examination (general) (routine) with abnormal findings: Secondary | ICD-10-CM | POA: Diagnosis not present

## 2022-07-08 DIAGNOSIS — M25511 Pain in right shoulder: Secondary | ICD-10-CM | POA: Diagnosis not present

## 2022-07-08 DIAGNOSIS — M7918 Myalgia, other site: Secondary | ICD-10-CM | POA: Diagnosis not present

## 2022-07-08 DIAGNOSIS — M9902 Segmental and somatic dysfunction of thoracic region: Secondary | ICD-10-CM | POA: Diagnosis not present

## 2022-07-08 DIAGNOSIS — M7541 Impingement syndrome of right shoulder: Secondary | ICD-10-CM | POA: Diagnosis not present

## 2022-07-09 LAB — CYTOLOGY - PAP
Comment: NEGATIVE
Diagnosis: UNDETERMINED — AB
High risk HPV: NEGATIVE

## 2022-09-01 DIAGNOSIS — E039 Hypothyroidism, unspecified: Secondary | ICD-10-CM | POA: Diagnosis not present

## 2022-09-01 DIAGNOSIS — M7541 Impingement syndrome of right shoulder: Secondary | ICD-10-CM | POA: Diagnosis not present

## 2022-09-01 DIAGNOSIS — M9902 Segmental and somatic dysfunction of thoracic region: Secondary | ICD-10-CM | POA: Diagnosis not present

## 2022-09-01 DIAGNOSIS — R7401 Elevation of levels of liver transaminase levels: Secondary | ICD-10-CM | POA: Diagnosis not present

## 2022-09-01 DIAGNOSIS — M25511 Pain in right shoulder: Secondary | ICD-10-CM | POA: Diagnosis not present

## 2022-09-01 DIAGNOSIS — M7918 Myalgia, other site: Secondary | ICD-10-CM | POA: Diagnosis not present

## 2022-09-08 DIAGNOSIS — H6992 Unspecified Eustachian tube disorder, left ear: Secondary | ICD-10-CM | POA: Diagnosis not present

## 2022-09-08 DIAGNOSIS — M25511 Pain in right shoulder: Secondary | ICD-10-CM | POA: Diagnosis not present

## 2022-09-08 DIAGNOSIS — M7918 Myalgia, other site: Secondary | ICD-10-CM | POA: Diagnosis not present

## 2022-09-08 DIAGNOSIS — M9902 Segmental and somatic dysfunction of thoracic region: Secondary | ICD-10-CM | POA: Diagnosis not present

## 2022-09-08 DIAGNOSIS — M7541 Impingement syndrome of right shoulder: Secondary | ICD-10-CM | POA: Diagnosis not present

## 2022-10-22 DIAGNOSIS — M7541 Impingement syndrome of right shoulder: Secondary | ICD-10-CM | POA: Diagnosis not present

## 2022-10-22 DIAGNOSIS — M9902 Segmental and somatic dysfunction of thoracic region: Secondary | ICD-10-CM | POA: Diagnosis not present

## 2022-10-22 DIAGNOSIS — M25511 Pain in right shoulder: Secondary | ICD-10-CM | POA: Diagnosis not present

## 2022-10-22 DIAGNOSIS — M7918 Myalgia, other site: Secondary | ICD-10-CM | POA: Diagnosis not present

## 2022-11-16 DIAGNOSIS — M9902 Segmental and somatic dysfunction of thoracic region: Secondary | ICD-10-CM | POA: Diagnosis not present

## 2022-11-16 DIAGNOSIS — M25511 Pain in right shoulder: Secondary | ICD-10-CM | POA: Diagnosis not present

## 2022-11-16 DIAGNOSIS — M7918 Myalgia, other site: Secondary | ICD-10-CM | POA: Diagnosis not present

## 2022-11-16 DIAGNOSIS — M7541 Impingement syndrome of right shoulder: Secondary | ICD-10-CM | POA: Diagnosis not present

## 2023-01-29 DIAGNOSIS — M7918 Myalgia, other site: Secondary | ICD-10-CM | POA: Diagnosis not present

## 2023-01-29 DIAGNOSIS — M25511 Pain in right shoulder: Secondary | ICD-10-CM | POA: Diagnosis not present

## 2023-01-29 DIAGNOSIS — M9902 Segmental and somatic dysfunction of thoracic region: Secondary | ICD-10-CM | POA: Diagnosis not present

## 2023-01-29 DIAGNOSIS — M7541 Impingement syndrome of right shoulder: Secondary | ICD-10-CM | POA: Diagnosis not present

## 2023-02-23 DIAGNOSIS — M7918 Myalgia, other site: Secondary | ICD-10-CM | POA: Diagnosis not present

## 2023-02-23 DIAGNOSIS — M25511 Pain in right shoulder: Secondary | ICD-10-CM | POA: Diagnosis not present

## 2023-02-23 DIAGNOSIS — M9902 Segmental and somatic dysfunction of thoracic region: Secondary | ICD-10-CM | POA: Diagnosis not present

## 2023-02-23 DIAGNOSIS — M7541 Impingement syndrome of right shoulder: Secondary | ICD-10-CM | POA: Diagnosis not present

## 2023-07-13 DIAGNOSIS — M7541 Impingement syndrome of right shoulder: Secondary | ICD-10-CM | POA: Diagnosis not present

## 2023-07-13 DIAGNOSIS — M9902 Segmental and somatic dysfunction of thoracic region: Secondary | ICD-10-CM | POA: Diagnosis not present

## 2023-07-13 DIAGNOSIS — M25511 Pain in right shoulder: Secondary | ICD-10-CM | POA: Diagnosis not present

## 2023-07-13 DIAGNOSIS — M7918 Myalgia, other site: Secondary | ICD-10-CM | POA: Diagnosis not present

## 2023-07-26 DIAGNOSIS — E559 Vitamin D deficiency, unspecified: Secondary | ICD-10-CM | POA: Diagnosis not present

## 2023-07-26 DIAGNOSIS — Z79899 Other long term (current) drug therapy: Secondary | ICD-10-CM | POA: Diagnosis not present

## 2023-07-26 DIAGNOSIS — E039 Hypothyroidism, unspecified: Secondary | ICD-10-CM | POA: Diagnosis not present

## 2023-07-26 DIAGNOSIS — Z13220 Encounter for screening for lipoid disorders: Secondary | ICD-10-CM | POA: Diagnosis not present

## 2023-07-28 DIAGNOSIS — Z0000 Encounter for general adult medical examination without abnormal findings: Secondary | ICD-10-CM | POA: Diagnosis not present

## 2023-07-28 DIAGNOSIS — Z23 Encounter for immunization: Secondary | ICD-10-CM | POA: Diagnosis not present

## 2023-08-05 ENCOUNTER — Other Ambulatory Visit: Payer: Self-pay | Admitting: Family Medicine

## 2023-08-05 ENCOUNTER — Other Ambulatory Visit (HOSPITAL_COMMUNITY)
Admission: RE | Admit: 2023-08-05 | Discharge: 2023-08-05 | Disposition: A | Discharge status: Home / Self Care | Payer: BC Managed Care – PPO | Source: Ambulatory Visit | Attending: Family Medicine | Admitting: Family Medicine

## 2023-08-05 DIAGNOSIS — R896 Abnormal cytological findings in specimens from other organs, systems and tissues: Secondary | ICD-10-CM | POA: Diagnosis not present

## 2023-08-05 DIAGNOSIS — Z01411 Encounter for gynecological examination (general) (routine) with abnormal findings: Secondary | ICD-10-CM | POA: Insufficient documentation

## 2023-08-12 LAB — CYTOLOGY - PAP
Comment: NEGATIVE
Diagnosis: UNDETERMINED — AB
High risk HPV: NEGATIVE

## 2023-08-24 ENCOUNTER — Ambulatory Visit (INDEPENDENT_AMBULATORY_CARE_PROVIDER_SITE_OTHER): Payer: BC Managed Care – PPO

## 2023-08-24 ENCOUNTER — Encounter: Payer: Self-pay | Admitting: Podiatry

## 2023-08-24 ENCOUNTER — Ambulatory Visit (INDEPENDENT_AMBULATORY_CARE_PROVIDER_SITE_OTHER): Payer: BC Managed Care – PPO | Admitting: Podiatry

## 2023-08-24 DIAGNOSIS — M778 Other enthesopathies, not elsewhere classified: Secondary | ICD-10-CM

## 2023-08-24 DIAGNOSIS — M25572 Pain in left ankle and joints of left foot: Secondary | ICD-10-CM

## 2023-08-24 NOTE — Patient Instructions (Signed)
Wear brace as tolerated for 1 month  Recommend good over the counter inserts for added arch support such as Powersteps or Superfeet.  Recommend Rest, Ice, Compression with an Ace wrap and elevation of the leg above the level of the heart.  May take over the counter Aleve twice a day when having a flare up.

## 2023-08-24 NOTE — Progress Notes (Signed)
       Chief Complaint  Patient presents with   Foot Pain    Dorsal/lateral foot left - states it feels like a "catch" in her foot when she walks or moves it x couple months, no injury, history of ganglion-wonders if she has another one, no treatment   New Patient (Initial Visit)    Est pt 2020    HPI: 42 y.o. female presents today for assessment of her left foot.  Patient states that over the past month or so she has noticed that the foot feels like it "catches".  She identifies the area around her subtalar joint and sinus tarsi when she experiences this.  She states that it has become intermittently painful over the past week.  She denies any significant pain today.  She does work on her feet as a Occupational psychologist.  She does state that she has upcoming travel plans this weekend.  Denies any nausea, vomiting, fever, chills, chest pain, shortness of breath.  Past Medical History:  Diagnosis Date   Allergy    GERD (gastroesophageal reflux disease)    Thyroid disease     Past Surgical History:  Procedure Laterality Date   LAPAROSCOPIC REMOVAL ABDOMINAL MASS     WISDOM TOOTH EXTRACTION      No Known Allergies  ROS - negative except as stated in HPI   Physical Exam: There were no vitals filed for this visit.  General: The patient is alert and oriented x3 in no acute distress.  Dermatology: Skin is warm, dry and supple bilateral lower extremities. Interspaces are clear of maceration and debris.    Vascular: Palpable pedal pulses bilaterally. Capillary refill within normal limits.  No erythema or calor.  Neurological: Light touch sensation grossly intact bilateral feet.   Musculoskeletal Exam: No pedal deformities noted.  Muscle strength 5/5 in dorsiflexion, paraphasia, inversion, eversion bilaterally.  No symptomatic limitations in range of motion.  Ankle joint and subtalar joint left foot nonpainful without crepitus.  Localized edema present left greater than right. Mild  tenderness on palpation of left sinus tarsi.  Radiographic Exam:  Normal osseous mineralization. Joint spaces preserved.  No fractures or osseous irregularities noted. Rectus foot type appreciated. No arthiritc changes to subtalar joint, CC joint, 4th and 5th TMT joints  Assessment/Plan of Care: 1. Sinus tarsitis of left foot      No orders of the defined types were placed in this encounter.  None  Discussed clinical findings with patient today.  # Sinus tarsitis left foot -Corticosteroid injection was offered to the patient today, patient deferred due to her overall mild symptoms at this point -Dispensed ASO ankle brace to provide some stabilization and extra support of the subtalar joint -Discussed that good-quality over-the-counter orthotics may be beneficial such as power steps or Superfeet -Discussed RICE therapy with the patient experiences a flareup -Patient may take over-the-counter Aleve twice a day during the flareup -Follow-up as needed    Rockie Vawter L. Marchia Bond, AACFAS [facility]     [address] [place], [place] [address]                Office [phone]  Fax [phone]

## 2023-12-09 DIAGNOSIS — M9904 Segmental and somatic dysfunction of sacral region: Secondary | ICD-10-CM | POA: Diagnosis not present

## 2023-12-09 DIAGNOSIS — M9903 Segmental and somatic dysfunction of lumbar region: Secondary | ICD-10-CM | POA: Diagnosis not present

## 2023-12-09 DIAGNOSIS — M9902 Segmental and somatic dysfunction of thoracic region: Secondary | ICD-10-CM | POA: Diagnosis not present

## 2023-12-09 DIAGNOSIS — M9905 Segmental and somatic dysfunction of pelvic region: Secondary | ICD-10-CM | POA: Diagnosis not present

## 2023-12-16 ENCOUNTER — Encounter: Payer: Self-pay | Admitting: Podiatry

## 2023-12-16 ENCOUNTER — Ambulatory Visit (INDEPENDENT_AMBULATORY_CARE_PROVIDER_SITE_OTHER): Admitting: Podiatry

## 2023-12-16 ENCOUNTER — Telehealth: Payer: Self-pay | Admitting: Podiatry

## 2023-12-16 DIAGNOSIS — M25572 Pain in left ankle and joints of left foot: Secondary | ICD-10-CM

## 2023-12-16 MED ORDER — TRIAMCINOLONE ACETONIDE 10 MG/ML IJ SUSP: 5.0000 mg | Freq: Once | INTRAMUSCULAR | Status: AC

## 2023-12-16 MED ORDER — MELOXICAM 15 MG PO TABS: 15.0000 mg | ORAL_TABLET | Freq: Every day | ORAL | 0 refills | Status: DC

## 2023-12-16 NOTE — Telephone Encounter (Signed)
 Let patient know her medication was sent to the pharmacy.

## 2023-12-16 NOTE — Telephone Encounter (Signed)
 Patient called and would like her medication sent to the [facility] pharmacy in [place] on [address]. Thank you.

## 2023-12-16 NOTE — Progress Notes (Signed)
 Chief Complaint  Patient presents with   Foot Pain    Rm15: F/U TARSITIS OF LEFT FOOT PAIN STILL THE SAME NO NEW AGGRAVATION JUST STANDS ALL DAY. TRIED DIFFERENT BRACES, IBUPROFEN WITH NO RELIEF.     HPI: 43 y.o. female presents today for follow up evaluation of left foot sinus tarsi pain, hindfoot pain.  Reports that her pain is about the same and rates it as a 3-4 out of 10.  It is worse with standing all day.  She works as a Transport planner.  She has taken ibuprofen some which helps intermittently but does not get lasting relief.  Denies any nausea, vomiting, fever, chills, chest pain, shortness of breath.  Past Medical History:  Diagnosis Date   Allergy    GERD (gastroesophageal reflux disease)    Thyroid disease     Past Surgical History:  Procedure Laterality Date   LAPAROSCOPIC REMOVAL ABDOMINAL MASS     WISDOM TOOTH EXTRACTION      No Known Allergies  ROS - negative except as stated in HPI   Physical Exam: There were no vitals filed for this visit.  General: The patient is alert and oriented x3 in no acute distress.  Dermatology: Skin is warm, dry and supple bilateral lower extremities. Interspaces are clear of maceration and debris.    Vascular: Palpable pedal pulses bilaterally. Capillary refill within normal limits.  No erythema or calor.  Neurological: Light touch sensation grossly intact bilateral feet.   Musculoskeletal Exam: No pedal deformities noted.  Muscle strength 5/5 in dorsiflexion, plantarflexion, inversion, eversion bilaterally.  No symptomatic limitations in range of motion.  Localized edema present left greater than right. Mild tenderness on palpation of left sinus tarsi.  Mild pain with subtalar joint range of motion without crepitus.  No ankle joint pain with range of motion, no crepitus  Radiographic Exam: Prior imaging from 08/24/2023 Normal osseous mineralization. Joint spaces preserved.  No fractures or osseous irregularities noted.  Rectus foot type appreciated. No arthiritc changes to subtalar joint, CC joint, 4th and 5th TMT joints  Assessment/Plan of Care: 1. Sinus tarsitis of left foot      Meds ordered this encounter  Medications   triamcinolone acetonide (KENALOG) 10 MG/ML injection 5 mg   meloxicam (MOBIC) 15 MG tablet    Sig: Take 1 tablet (15 mg total) by mouth daily for 21 days.    Dispense:  21 tablet    Refill:  0   None  Discussed clinical findings with patient today.  # Sinus tarsitis left foot - Verbal consent to administer intra-articular corticosteroid injection today.  Betadine skin prep to left sinus tarsi.  0.5 cc of half percent Marcaine and 0.5 cc of Kenalog 10 was injected without incident.  Band-Aid applied.  Patient tolerated this well. - Ankle brace was not very helpful today.  Dispensed power step orthotics today and discussed good supportive shoe gear.  May consider cam boot immobilization if symptoms do not improve -Discussed RICE therapy with the patient experiences a flareup - Oral meloxicam sent to patient's pharmacy - Return to clinic in 3 to 4 weeks if any symptoms are lingering.  May consider CAM boot immobilization    Naasia Weilbacher L. Marchia Bond, AACFAS [facility]     [address]  [place], [place] [address]                Office [phone]  Fax [phone]

## 2023-12-20 ENCOUNTER — Other Ambulatory Visit: Payer: Self-pay

## 2023-12-20 ENCOUNTER — Telehealth: Payer: Self-pay

## 2023-12-20 DIAGNOSIS — M25572 Pain in left ankle and joints of left foot: Secondary | ICD-10-CM

## 2023-12-20 MED ORDER — MELOXICAM 15 MG PO TABS: 15.0000 mg | ORAL_TABLET | Freq: Every day | ORAL | 0 refills | Status: AC

## 2023-12-20 MED ORDER — TRIAMCINOLONE ACETONIDE 0.1 % EX CREA
1.0000 | TOPICAL_CREAM | Freq: Two times a day (BID) | CUTANEOUS | 0 refills | Status: AC
Start: 2023-12-20 — End: ?

## 2023-12-20 NOTE — Telephone Encounter (Signed)
 PT states pharmacy still have not recv prescription that was sent [address] [facility] [id number] [place], [place] - [address]  Phone: [phone] Fax: [phone]

## 2024-01-03 DIAGNOSIS — J069 Acute upper respiratory infection, unspecified: Secondary | ICD-10-CM | POA: Diagnosis not present

## 2024-02-01 ENCOUNTER — Other Ambulatory Visit: Payer: Self-pay | Admitting: Family Medicine

## 2024-02-01 DIAGNOSIS — Z1231 Encounter for screening mammogram for malignant neoplasm of breast: Secondary | ICD-10-CM

## 2024-02-15 ENCOUNTER — Ambulatory Visit
Admission: RE | Admit: 2024-02-15 | Discharge: 2024-02-15 | Disposition: A | Discharge status: Home / Self Care | Source: Ambulatory Visit

## 2024-02-15 DIAGNOSIS — Z1231 Encounter for screening mammogram for malignant neoplasm of breast: Secondary | ICD-10-CM

## 2024-04-17 DIAGNOSIS — M9902 Segmental and somatic dysfunction of thoracic region: Secondary | ICD-10-CM | POA: Diagnosis not present

## 2024-04-17 DIAGNOSIS — M7912 Myalgia of auxiliary muscles, head and neck: Secondary | ICD-10-CM | POA: Diagnosis not present

## 2024-04-17 DIAGNOSIS — M9901 Segmental and somatic dysfunction of cervical region: Secondary | ICD-10-CM | POA: Diagnosis not present

## 2024-04-17 DIAGNOSIS — M7918 Myalgia, other site: Secondary | ICD-10-CM | POA: Diagnosis not present

## 2024-05-10 DIAGNOSIS — M7918 Myalgia, other site: Secondary | ICD-10-CM | POA: Diagnosis not present

## 2024-05-10 DIAGNOSIS — M9901 Segmental and somatic dysfunction of cervical region: Secondary | ICD-10-CM | POA: Diagnosis not present

## 2024-05-10 DIAGNOSIS — M7912 Myalgia of auxiliary muscles, head and neck: Secondary | ICD-10-CM | POA: Diagnosis not present

## 2024-05-10 DIAGNOSIS — M9902 Segmental and somatic dysfunction of thoracic region: Secondary | ICD-10-CM | POA: Diagnosis not present

## 2024-07-13 DIAGNOSIS — M7918 Myalgia, other site: Secondary | ICD-10-CM | POA: Diagnosis not present

## 2024-07-13 DIAGNOSIS — M9902 Segmental and somatic dysfunction of thoracic region: Secondary | ICD-10-CM | POA: Diagnosis not present

## 2024-07-13 DIAGNOSIS — M7912 Myalgia of auxiliary muscles, head and neck: Secondary | ICD-10-CM | POA: Diagnosis not present

## 2024-07-13 DIAGNOSIS — M9901 Segmental and somatic dysfunction of cervical region: Secondary | ICD-10-CM | POA: Diagnosis not present

## 2024-08-23 DIAGNOSIS — M222X1 Patellofemoral disorders, right knee: Secondary | ICD-10-CM | POA: Diagnosis not present

## 2024-08-23 DIAGNOSIS — M25561 Pain in right knee: Secondary | ICD-10-CM | POA: Diagnosis not present

## 2024-09-14 DIAGNOSIS — M9901 Segmental and somatic dysfunction of cervical region: Secondary | ICD-10-CM | POA: Diagnosis not present

## 2024-09-14 DIAGNOSIS — M7918 Myalgia, other site: Secondary | ICD-10-CM | POA: Diagnosis not present

## 2024-09-14 DIAGNOSIS — M9902 Segmental and somatic dysfunction of thoracic region: Secondary | ICD-10-CM | POA: Diagnosis not present

## 2024-09-14 DIAGNOSIS — M7912 Myalgia of auxiliary muscles, head and neck: Secondary | ICD-10-CM | POA: Diagnosis not present

## 2024-09-19 DIAGNOSIS — Z0000 Encounter for general adult medical examination without abnormal findings: Secondary | ICD-10-CM | POA: Diagnosis not present

## 2024-09-19 DIAGNOSIS — E66812 Obesity, class 2: Secondary | ICD-10-CM | POA: Diagnosis not present

## 2024-09-19 DIAGNOSIS — E039 Hypothyroidism, unspecified: Secondary | ICD-10-CM | POA: Diagnosis not present

## 2024-09-19 DIAGNOSIS — Z13220 Encounter for screening for lipoid disorders: Secondary | ICD-10-CM | POA: Diagnosis not present

## 2024-09-19 DIAGNOSIS — Z6839 Body mass index (BMI) 39.0-39.9, adult: Secondary | ICD-10-CM | POA: Diagnosis not present

## 2024-09-19 DIAGNOSIS — Z114 Encounter for screening for human immunodeficiency virus [HIV]: Secondary | ICD-10-CM | POA: Diagnosis not present

## 2024-09-19 DIAGNOSIS — R87620 Atypical squamous cells of undetermined significance on cytologic smear of vagina (ASC-US): Secondary | ICD-10-CM | POA: Diagnosis not present
